# Patient Record
Sex: Female | Born: 1995 | Hispanic: No | Marital: Single | Smoking: Never smoker
Health system: Southern US, Community
[De-identification: ages and names within clinical notes are randomized; demographics above are authoritative.]

## PROBLEM LIST (undated history)

## (undated) DIAGNOSIS — B349 Viral infection, unspecified: Secondary | ICD-10-CM

## (undated) DIAGNOSIS — F32A Depression, unspecified: Secondary | ICD-10-CM

## (undated) DIAGNOSIS — F909 Attention-deficit hyperactivity disorder, unspecified type: Secondary | ICD-10-CM

## (undated) DIAGNOSIS — Z09 Encounter for follow-up examination after completed treatment for conditions other than malignant neoplasm: Secondary | ICD-10-CM

## (undated) DIAGNOSIS — F329 Major depressive disorder, single episode, unspecified: Secondary | ICD-10-CM

## (undated) DIAGNOSIS — J039 Acute tonsillitis, unspecified: Secondary | ICD-10-CM

## (undated) DIAGNOSIS — F419 Anxiety disorder, unspecified: Secondary | ICD-10-CM

## (undated) DIAGNOSIS — N10 Acute tubulo-interstitial nephritis: Principal | ICD-10-CM

## (undated) DIAGNOSIS — J0391 Acute recurrent tonsillitis, unspecified: Secondary | ICD-10-CM

## (undated) DIAGNOSIS — L709 Acne, unspecified: Secondary | ICD-10-CM

## (undated) HISTORY — DX: Viral infection, unspecified: B34.9

## (undated) HISTORY — DX: Acute pyelonephritis: N10

## (undated) HISTORY — PX: US ECHOCARDIOGRAPHY: HXRAD669

## (undated) HISTORY — DX: Acute recurrent tonsillitis, unspecified: J03.91

## (undated) HISTORY — DX: Acute tonsillitis, unspecified: J03.90

## (undated) HISTORY — PX: OTHER SURGICAL HISTORY: SHX169

## (undated) HISTORY — DX: Encounter for follow-up examination after completed treatment for conditions other than malignant neoplasm: Z09

## (undated) HISTORY — DX: Acne, unspecified: L70.9

## (undated) HISTORY — DX: Depression, unspecified: F32.A

## (undated) HISTORY — DX: Major depressive disorder, single episode, unspecified: F32.9

## (undated) HISTORY — DX: Anxiety disorder, unspecified: F41.9

## (undated) HISTORY — DX: Attention-deficit hyperactivity disorder, unspecified type: F90.9

---
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
## 2014-01-01 ENCOUNTER — Other Ambulatory Visit: Payer: Self-pay | Admitting: Internal Medicine

## 2014-01-04 ENCOUNTER — Telehealth: Payer: Self-pay | Admitting: Family Medicine

## 2014-01-04 NOTE — Telephone Encounter (Signed)
This patient is due for her yearly wellness exam.  Please schedule the pt.  Let me know when she has been scheduled.  Thanks!

## 2014-01-04 NOTE — Telephone Encounter (Signed)
Sent for #90 to the pharmacy.  Pt must have an office visit for further refills.  She is due for her yearly wellness.  Will send a message to the front desk staff to schedule the pt.

## 2014-01-04 NOTE — Telephone Encounter (Signed)
Pt will cb after she looks at her schedule for the best time to come in.

## 2014-02-03 ENCOUNTER — Telehealth: Payer: Self-pay | Admitting: Internal Medicine

## 2014-02-03 NOTE — Telephone Encounter (Signed)
Pt needs new rx vyvanse 40 mg. Mom would like to know when does her daughter needs check up to continue to get med. Pt will be going back to school in [place]

## 2014-02-03 NOTE — Telephone Encounter (Signed)
Pt is past due for her appointment.  Please schedule.  I will then ask Dr. Demetrius CharityP to refill her prescription until she is seen.

## 2014-02-04 MED ORDER — LISDEXAMFETAMINE DIMESYLATE 40 MG PO CAPS: 40.0000 mg | ORAL_CAPSULE | ORAL | Status: DC

## 2014-02-04 NOTE — Telephone Encounter (Addendum)
Lm with pt dad

## 2014-02-04 NOTE — Telephone Encounter (Signed)
Written for 30 days until she can see Dr. Fabian SharpPanosh

## 2014-02-04 NOTE — Telephone Encounter (Signed)
Pt has been scheduled for her OV. Mom said they will be on vacation next week and need to have the rx  Vyvanse 40mg  today pt only has 4 pills

## 2014-02-04 NOTE — Telephone Encounter (Signed)
Patient's father notified that rx is ready.  He will have patient's mother swing by and pick it up.

## 2014-02-04 NOTE — Telephone Encounter (Signed)
Pt is scheduled for 02/22/14.  Will see if Dr. Clent RidgesFry can fill as Dr. Fabian SharpPanosh is out of the office.

## 2014-02-22 ENCOUNTER — Encounter: Payer: Self-pay | Admitting: Internal Medicine

## 2014-02-22 ENCOUNTER — Ambulatory Visit (INDEPENDENT_AMBULATORY_CARE_PROVIDER_SITE_OTHER): Payer: BC Managed Care – PPO | Admitting: Internal Medicine

## 2014-02-22 VITALS — BP 110/80 | HR 91 | Temp 99.1°F | Ht 66.0 in | Wt 143.0 lb

## 2014-02-22 DIAGNOSIS — F988 Other specified behavioral and emotional disorders with onset usually occurring in childhood and adolescence: Secondary | ICD-10-CM

## 2014-02-22 DIAGNOSIS — F4323 Adjustment disorder with mixed anxiety and depressed mood: Secondary | ICD-10-CM

## 2014-02-22 DIAGNOSIS — Z79899 Other long term (current) drug therapy: Secondary | ICD-10-CM | POA: Insufficient documentation

## 2014-02-22 DIAGNOSIS — Z0000 Encounter for general adult medical examination without abnormal findings: Secondary | ICD-10-CM

## 2014-02-22 MED ORDER — LISDEXAMFETAMINE DIMESYLATE 40 MG PO CAPS: 40.0000 mg | ORAL_CAPSULE | ORAL | Status: DC

## 2014-02-22 MED ORDER — FLUOXETINE HCL 20 MG PO CAPS: 20.0000 mg | ORAL_CAPSULE | Freq: Every day | ORAL | Status: DC

## 2014-02-22 NOTE — Progress Notes (Signed)
Pre visit review using our clinic review tool, if applicable. No additional management support is needed unless otherwise documented below in the visit note.   Chief Complaint  Patient presents with  . Follow-up    3 mths add meds and fluoxetine  . Annual Exam    HPI: Patient comes in today for [place] visit  And also med check. For medical conditions. Since her last visit she has done fairly well but did have an episode wonders if she is allergic to shellfish.  ? Had lobster.  Extreme nausea. Vomiting dizziness.  No diarrhea. No rash itching or other swelling. Has never had this before ADD medication; Vyvanse taking every. Day and really helps her Focus if she doesn't take it it is very Hard to gett hings done. No change in special interactions.  Sleep ok. Feels that she tolerates well she is just finished her freshman year at [employer] interested in either nursing or psychology. Will be living in an apartment this year. Fluoxetine is really helped her mood would like to remain on it denies any specific side effects had been on it for anxiety with some depression. TObacco no etoh "normal" couple beers ocass Sweets beverages  Not a lot.  RD:  Denies SA no denies risk of preg or sti says has had hpv series Periods normal   Lasting 5 days  she is on OCPs no concerns. Would like cholesterol blood sugar checked she ate today but a while bacteria [place]. Health Maintenance  Topic Date Due  . Pap Smear  12/26/2012  . Tetanus/tdap  12/26/2013  . Influenza Vaccine  03/06/2014   Health Maintenance Review  ROS: No major injury concussions GI GU symptoms. GEN/ HEENT: No fever, significant weight changes sweats headaches vision problems hearing changes, CV/ PULM; No chest pain shortness of breath cough, syncope,edema  change in exercise tolerance. GI /GU: No adominal pain, vomiting, change in bowel habits. No blood in the stool. No significant GU symptoms. SKIN/HEME: ,no acute skin  rashes suspicious lesions or bleeding. No lymphadenopathy, nodules, masses.  NEURO/ PSYCH:  No neurologic signs such as weakness numbness. No depression anxiety. IMM/ Allergy: No unusual infections.  Allergy .   REST of 12 system review negative except as per HPI   Past Medical History  Diagnosis Date  . ADHD (attention deficit hyperactivity disorder)     on meds from 3rd grade, psycho ed testing May 2011 [facility]  . Anxiety   . Depression   . Acne   . Tonsillitis 10/29/2011    10 13 strep  . Infectious mononucleosis-like syndrome 09/25/2011    Monospot negative blood work pending strep culture pending. Non-alarming exam   . 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 Acute pyelonephritis 02/18/2013    with nv self rx with amox    . Recurrent tonsillitis 06/05/2012    Family History  Problem Relation Age of Onset  . Depression Father     History   Social History  . Marital Status: Single    Spouse Name: N/A    Number of Children: N/A  . Years of Education: N/A   Social History Main Topics  . Smoking status: Never Smoker   . Smokeless tobacco: Never Used  . Alcohol Use: No  . Drug Use: No  . Sexual Activity: None   Other Topics Concern  . None   Social History Narrative   HH of 4  Pet dog   [employer] since Kindergarten   [place]   Sleep ok   [place] [facility] psy[employer]    To be living appt . Next year     Outpatient Encounter Prescriptions as of 02/22/2014  Medication Sig  . FLUoxetine (PROZAC) 20 MG capsule Take 1 capsule (20 mg total) by mouth daily.  [name] GIANVI 3-0.02 MG tablet Take 1 tablet by mouth daily.  [name] lisdexamfetamine (VYVANSE) 40 MG capsule Take 1 capsule (40 mg total) by mouth every morning.  . [DISCONTINUED] FLUoxetine (PROZAC) 20 MG capsule TAKE ONE CAPSULE BY MOUTH DAILY  . [DISCONTINUED] lisdexamfetamine (VYVANSE) 40 MG capsule Take 1 capsule (40 mg total) by mouth every  morning.  . [DISCONTINUED] FLUoxetine (PROZAC) 20 MG capsule TAKE 1 CAPSULE (20 MG TOTAL) BY MOUTH DAILY.    EXAM:  BP 110/80  Pulse 91  Temp(Src) 99.1 F (37.3 C) (Oral)  Ht _0  (1.676 m)  Wt 143 lb (64.864 kg)  BMI 23.09 kg/m2  Body mass index is 23.09 kg/(m^2).  Physical Exam: Vital signs reviewed NOI:BBCW is a well-developed well-nourished alert cooperative    who appearsr stated age in no acute distress.  HEENT: normocephalic atraumatic , Eyes: PERRL EOM's full, conjunctiva clear, Nares: paten,t no deformity discharge or tenderness., Ears: no deformity EAC's clear TMs with normal landmarks. Mouth: clear OP, no lesions, edema.  Moist mucous membranes. Dentition in adequate repair. NECK: supple without masses, thyromegaly or bruits. CHEST/PULM:  Clear to auscultation and percussion breath sounds equal no wheeze , rales or rhonchi. No chest wall deformities or tenderness. CV: PMI is nondisplaced, S1 S2 no gallops, murmurs, rubs. Peripheral pulses are full without delay.No JVD  Breast: normal by inspection . No dimpling, discharge, masses, tenderness or discharge . Right has 3 superfical pink areas not really nodules  Nipple clear .  ABDOMEN: Bowel sounds normal nontender  No guard or rebound, no hepato splenomegal no CVA tenderness.  No hernia. Extremtities:  No clubbing cyanosis or edema, no acute joint swelling or redness no focal atrophy NEURO:  Oriented x3, cranial nerves 3-12 appear to be intact, no obvious focal weakness,gait within normal limits no abnormal reflexes or asymmetrical SKIN: No acute rashes normal turgor, color, no bruising or petechiae. PSYCH: Oriented, good eye contact, no obvious depression anxiety, cognition and judgment appear normal. LN: no cervical axillary inguinal adenopathy Back no scoliosis no focal concerns.  Lab Results  Component Value Date   WBC 6.2 08/03/2013   HGB 13.3 08/03/2013   HCT 39.0 08/03/2013   PLT 211.0 08/03/2013   GLUCOSE 84  08/03/2013   CHOL 121 09/22/2012   TRIG 38.0 09/22/2012   HDL 50.60 09/22/2012   LDLCALC 63 09/22/2012   ALT 13 09/22/2012   AST 17 09/22/2012   NA 137 08/03/2013   K 4.3 08/03/2013   CL 104 08/03/2013   CREATININE 0.6 08/03/2013   BUN 10 08/03/2013   CO2 27 08/03/2013   TSH 2.17 08/03/2013    ASSESSMENT AND PLAN:  Discussed the following assessment and plan:  Visit for preventive health examination - Apparently up-to-date flu vaccine in the fall; a 10 to continue healthy lifestyle - Plan: Lipid panel, Basic metabolic panel  Attention deficit disorder without mention of hyperactivity - Continue medication benefit more than risk without significant side effect dispense 90 patient is aware precautions high-risk medicine  Medication management - Plan: Lipid panel, Basic metabolic panel  Adjustment disorder with mixed anxiety  and depressed mood - Seems to be quite stable on fluoxetine 20 mg a day refill 90 will follow.  Patient Care Team: Burnis Medin, MD as PCP - General Patient Instructions  ROV in about 6 months for med evaluation or other as needed. I think the breast area is a plugged gland  But recheck if getting  More and progressing.   Health Maintenance - 28-56 Years Old SCHOOL PERFORMANCE After high school, you may attend college or technical or vocational school, enroll in the [employer], or enter the workforce. PHYSICAL, SOCIAL, AND EMOTIONAL DEVELOPMENT  One hour of regular physical activity daily is recommended. Continue to participate in sports.  Develop your own interests and consider community service or volunteerism.  Make decisions about college and work plans.  Throughout these years, you should assume responsibility for your own health care. Increasing independence is important for you.  You may be exploring your sexual identity. Understand that you should never be in a situation that makes you feel uncomfortable, and tell your partner if you do not want to  engage in sexual activity.  Body image may become important to you. Be mindful that eating disorders can develop at this time. Talk to your parents or other caregivers if you have concerns about body image, weight gain, or losing weight.  You may notice mood disturbances, depression, anxiety, attention problems, or trouble with alcohol. Talk to your health care provider if you have concerns about mental illness.  Set limits for yourself and talk with your parents or other caregivers about independent decision making.  Handle conflict without physical violence.  Avoid loud noises which may impair hearing.  Limit television and computer time to 2 hours each day. Individuals who engage in excessive inactivity are more likely to become overweight. RECOMMENDED IMMUNIZATIONS  Influenza vaccine.  All adults should be immunized every year.  All adults, including pregnant women and people with hives-only allergy to eggs, can receive the inactivated influenza (IIV) vaccine.  Adults aged 18-49 years can receive the recombinant influenza (RIV) vaccine. The RIV vaccine does not contain any egg protein.  Tetanus, diphtheria, and acellular pertussis (Td, Tdap) vaccine.  Pregnant women should receive 1 dose of Tdap vaccine during each pregnancy. The dose should be obtained regardless of the length of time since the last dose. Immunization is preferred during the 27th to 36th week of gestation.  An adult who has not previously received Tdap or who does not know his or her vaccine status should receive 1 dose of Tdap. This initial dose should be followed by tetanus and diphtheria toxoids (Td) booster doses every 10 years.  Adults with an unknown or incomplete history of completing a 3-dose immunization series with Td-containing vaccines should begin or complete a primary immunization series including a Tdap dose.  Adults should receive a Td booster every 10 years.  Varicella vaccine.  An adult  without evidence of immunity to varicella should receive 2 doses or a second dose if he or she has previously received 1 dose.  Pregnant females who do not have evidence of immunity should receive the first dose after pregnancy. This first dose should be obtained before leaving the health care facility. The second dose should be obtained 4-8 weeks after the first dose.  Human papillomavirus (HPV) vaccine.  Females aged 13-26 years who have not received the vaccine previously should obtain the 3-dose series.  The vaccine is not recommended for pregnant females. However, pregnancy testing is not needed before receiving  a dose. If a female is found to be pregnant after receiving a dose, no treatment is needed. In that case, the remaining doses should be delayed until after the pregnancy.  Males aged 37-21 years who have not received the vaccine previously should receive the 3-dose series. Males aged 22-26 years may be immunized.  Immunization is recommended through the age of 59 years for any female who has sex with males and did not get any or all doses earlier.  Immunization is recommended for any person with an immunocompromised condition through the age of 65 years if he or she did not get any or all doses earlier.  During the 3-dose series, the second dose should be obtained 4-8 weeks after the first dose. The third dose should be obtained 24 weeks after the first dose and 16 weeks after the second dose.  Measles, mumps, and rubella (MMR) vaccine.  Adults born in [birth year] or later should have 1 or more doses of MMR vaccine unless there is a contraindication to the vaccine or there is laboratory evidence of immunity to each of the three diseases.  A routine second dose of MMR vaccine should be obtained at least 28 days after the first dose for students attending postsecondary schools, health care workers, and international travelers.  For females of childbearing age, rubella immunity should be  determined. If there is no evidence of immunity, females who are not pregnant should be vaccinated. If there is no evidence of immunity, females who are pregnant should delay immunization until after pregnancy.  Pneumococcal 13-valent conjugate (PCV13) vaccine.  When indicated, a person who is uncertain of his or her immunization history and has no record of immunization should receive the PCV13 vaccine.  An adult aged 62 years or older who has certain medical conditions and has not been previously immunized should receive 1 dose of PCV13 vaccine. This PCV13 should be followed with a dose of pneumococcal polysaccharide (PPSV23) vaccine. The PPSV23 vaccine dose should be obtained at least 8 weeks after the dose of PCV13 vaccine.  An adult aged 33 years or older who has certain medical conditions and previously received 1 or more doses of PPSV23 vaccine should receive 1 dose of PCV13. The PCV13 vaccine dose should be obtained 1 or more years after the last PPSV23 vaccine dose.  Pneumococcal polysaccharide (PPSV23) vaccine.  When PCV13 is also indicated, PCV13 should be obtained first.  An adult younger than age 43 years who has certain medical conditions should be immunized.  Any person who resides in a long-term care facility should be immunized.  An adult smoker should be immunized.  People with an immunocompromised condition and certain other conditions should receive both PCV13 and PPSV23 vaccines.  People with human immunodeficiency virus (HIV) infection should be immunized as soon as possible after diagnosis.  Immunization during chemotherapy or radiation therapy should be avoided.  Routine use of PPSV23 vaccine is not recommended for American Indians, [place] Natives, or people younger than 65 years unless there are medical conditions that require PPSV23 vaccine.  When indicated, people who have unknown immunization and have no record of immunization should receive PPSV23  vaccine.  One-time revaccination 5 years after the first dose of PPSV23 is recommended for people aged 19-64 years who have chronic kidney failure, nephrotic syndrome, asplenia, or immunocompromised conditions.  Meningococcal vaccine.  Adults with asplenia or persistent complement component deficiencies should receive 2 doses of quadrivalent meningococcal conjugate (MenACWY-D) vaccine. The doses should be obtained at least  2 months apart.  Microbiologists working with certain meningococcal bacteria, [place] recruits, people at risk during an outbreak, and people who travel to or live in countries with a high rate of meningitis should be immunized.  A first-year college student up through age 75 years who is living in a residence hall should receive a dose if he or she did not receive a dose on or after his or her 16th birthday.  Adults who have certain high-risk conditions should receive one or more doses of vaccine.  Hepatitis A vaccine.  Adults who wish to be protected from this disease, have certain high-risk conditions, work with hepatitis A-infected animals, work in hepatitis A research labs, or travel to or work in countries with a high rate of hepatitis A should be immunized.  Adults who were previously unvaccinated and who anticipate close contact with an international adoptee during the first 60 days after arrival in the [place] from a country with a high rate of hepatitis A should be immunized.  Hepatitis B vaccine.  Adults who wish to be protected from this disease, have certain high-risk conditions, may be exposed to blood or other infectious body fluids, are household contacts or sex partners of hepatitis B positive people, are clients or workers in certain care facilities, or travel to or work in countries with a high rate of hepatitis B should be immunized.  Haemophilus influenzae type b (Hib) vaccine.  A previously unvaccinated person with asplenia or sickle cell  disease or having a scheduled splenectomy should receive 1 dose of Hib vaccine.  Regardless of previous immunization, a recipient of a hematopoietic stem cell transplant should receive a 3-dose series 6-12 months after his or her successful transplant.  Hib vaccine is not recommended for adults with HIV infection. TESTING  Annual screening for vision and hearing problems is recommended. Vision should be screened at least once between 5-33 years of age.  You may be screened for anemia or tuberculosis.  You should have a blood test to check for high cholesterol.  You should be screened for alcohol and drug use.  If you are sexually active, you may be screened for sexually transmitted infections (STIs), pregnancy, or HIV. You should be screened for STIs if:  Your sexual activity has changed since the last screening test, and you are at an increased risk for chlamydia or gonorrhea. Ask your health care provider if you are at risk.  If you are at an increased risk for hepatitis B, you should be screened for this virus. You are considered at high risk for hepatitis B if you:  Were born in a country where hepatitis B occurs often. Talk with your health care provider about which countries are considered high risk.  Have parents who were born in a high-risk country and have not received a shot to protect against hepatitis B (hepatitis B vaccine).  Have HIV or AIDS.  Use needles to inject street drugs.  Live with or have sex with someone who has hepatitis B.  Are a man who has sex with other men (MSM).  Get hemodialysis treatment.  Take certain medicines for conditions like cancer, organ transplantation, or autoimmune conditions. NUTRITION   You should:  Have three servings of low-fat milk and dairy products daily. If you do not drink milk or consume dairy products, you should eat calcium-enriched foods, such as juice, bread, or cereal. Dark, leafy greens or canned fish are alternate  sources of calcium.  Drink plenty of water.  Fruit juice should be limited to 8-12 oz (240-360 mL) each day. Sugary beverages and sodas should be avoided.  Avoid eating foods high in fat, salt, or sugar, such as chips, candy, and cookies.  Avoid fast foods and limit eating out at restaurants.  Try not to skip meals, especially breakfast. You should eat a variety of vegetables, fruits, and lean meats.  Eat meals together as a family whenever possible. ORAL HEALTH Brush your teeth twice a day and floss at least once a day. You should have two dental exams a year.  SKIN CARE You should wear sunscreen when out in the sun. TALK TO SOMEONE ABOUT:  Precautions against pregnancy, contraception, and sexually transmitted infections.  Taking a prescription medicine daily to prevent HIV infection if you are at risk of being infected with HIV. This is called preexposure prophylaxis (PrEP). You are at risk if you:  Are a female who has sex with other males (MSM).  Are heterosexual and sexually active with more than one partner.  Take drugs by injection.  Are sexually active with a partner who has HIV.  Whether you are at high risk of being infected with HIV. If you choose to begin PrEP, you should first be tested for HIV. You should then be tested every 3 months for as long as you are taking PrEP.  Drug, tobacco, and alcohol use among your friends or at friends' homes. Smoking tobacco or marijuana and taking drugs have health consequences and may impact your brain development.  Appropriate use of over-the-counter or prescription medicines.  Driving guidelines and riding with friends.  The risks of drinking and driving or boating. Call someone if you have been drinking or using drugs and need a ride. WHAT'S NEXT? Visit your pediatrician or family physician once a year. By young adulthood, you should transition from your pediatrician to a family physician or internal medicine specialist. If you  are a female and are sexually active, you may want to begin annual physical exams with a gynecologist. Document Released: 10/18/2006 Document Revised: 07/28/2013 Document Reviewed: 11/07/2006 [facility] Patient Information 2015 [place], [place]. This information is not intended to replace advice given to you by your health care provider. Make sure you discuss any questions you have with your health care provider.     Standley Brooking. Panosh M.D.

## 2014-02-22 NOTE — Patient Instructions (Addendum)
ROV in about 6 months for med evaluation or other as needed. I think the breast area is a plugged gland  But recheck if getting  More and progressing.   Health Maintenance - 4-19 Years Old SCHOOL PERFORMANCE After high school, you may attend college or technical or vocational school, enroll in the [employer], or enter the workforce. PHYSICAL, SOCIAL, AND EMOTIONAL DEVELOPMENT  One hour of regular physical activity daily is recommended. Continue to participate in sports.  Develop your own interests and consider community service or volunteerism.  Make decisions about college and work plans.  Throughout these years, you should assume responsibility for your own health care. Increasing independence is important for you.  You may be exploring your sexual identity. Understand that you should never be in a situation that makes you feel uncomfortable, and tell your partner if you do not want to engage in sexual activity.  Body image may become important to you. Be mindful that eating disorders can develop at this time. Talk to your parents or other caregivers if you have concerns about body image, weight gain, or losing weight.  You may notice mood disturbances, depression, anxiety, attention problems, or trouble with alcohol. Talk to your health care provider if you have concerns about mental illness.  Set limits for yourself and talk with your parents or other caregivers about independent decision making.  Handle conflict without physical violence.  Avoid loud noises which may impair hearing.  Limit television and computer time to 2 hours each day. Individuals who engage in excessive inactivity are more likely to become overweight. RECOMMENDED IMMUNIZATIONS  Influenza vaccine.  All adults should be immunized every year.  All adults, including pregnant women and people with hives-only allergy to eggs, can receive the inactivated influenza (IIV) vaccine.  Adults aged 18-49 years can  receive the recombinant influenza (RIV) vaccine. The RIV vaccine does not contain any egg protein.  Tetanus, diphtheria, and acellular pertussis (Td, Tdap) vaccine.  Pregnant women should receive 1 dose of Tdap vaccine during each pregnancy. The dose should be obtained regardless of the length of time since the last dose. Immunization is preferred during the 27th to 36th week of gestation.  An adult who has not previously received Tdap or who does not know his or her vaccine status should receive 1 dose of Tdap. This initial dose should be followed by tetanus and diphtheria toxoids (Td) booster doses every 10 years.  Adults with an unknown or incomplete history of completing a 3-dose immunization series with Td-containing vaccines should begin or complete a primary immunization series including a Tdap dose.  Adults should receive a Td booster every 10 years.  Varicella vaccine.  An adult without evidence of immunity to varicella should receive 2 doses or a second dose if he or she has previously received 1 dose.  Pregnant females who do not have evidence of immunity should receive the first dose after pregnancy. This first dose should be obtained before leaving the health care facility. The second dose should be obtained 4-8 weeks after the first dose.  Human papillomavirus (HPV) vaccine.  Females aged 13-26 years who have not received the vaccine previously should obtain the 3-dose series.  The vaccine is not recommended for pregnant females. However, pregnancy testing is not needed before receiving a dose. If a female is found to be pregnant after receiving a dose, no treatment is needed. In that case, the remaining doses should be delayed until after the pregnancy.  Males aged 13-21  years who have not received the vaccine previously should receive the 3-dose series. Males aged 22-26 years may be immunized.  Immunization is recommended through the age of 45 years for any female who has sex  with males and did not get any or all doses earlier.  Immunization is recommended for any person with an immunocompromised condition through the age of 51 years if he or she did not get any or all doses earlier.  During the 3-dose series, the second dose should be obtained 4-8 weeks after the first dose. The third dose should be obtained 24 weeks after the first dose and 16 weeks after the second dose.  Measles, mumps, and rubella (MMR) vaccine.  Adults born in [birth year] or later should have 1 or more doses of MMR vaccine unless there is a contraindication to the vaccine or there is laboratory evidence of immunity to each of the three diseases.  A routine second dose of MMR vaccine should be obtained at least 28 days after the first dose for students attending postsecondary schools, health care workers, and international travelers.  For females of childbearing age, rubella immunity should be determined. If there is no evidence of immunity, females who are not pregnant should be vaccinated. If there is no evidence of immunity, females who are pregnant should delay immunization until after pregnancy.  Pneumococcal 13-valent conjugate (PCV13) vaccine.  When indicated, a person who is uncertain of his or her immunization history and has no record of immunization should receive the PCV13 vaccine.  An adult aged 45 years or older who has certain medical conditions and has not been previously immunized should receive 1 dose of PCV13 vaccine. This PCV13 should be followed with a dose of pneumococcal polysaccharide (PPSV23) vaccine. The PPSV23 vaccine dose should be obtained at least 8 weeks after the dose of PCV13 vaccine.  An adult aged 59 years or older who has certain medical conditions and previously received 1 or more doses of PPSV23 vaccine should receive 1 dose of PCV13. The PCV13 vaccine dose should be obtained 1 or more years after the last PPSV23 vaccine dose.  Pneumococcal polysaccharide (PPSV23)  vaccine.  When PCV13 is also indicated, PCV13 should be obtained first.  An adult younger than age 70 years who has certain medical conditions should be immunized.  Any person who resides in a long-term care facility should be immunized.  An adult smoker should be immunized.  People with an immunocompromised condition and certain other conditions should receive both PCV13 and PPSV23 vaccines.  People with human immunodeficiency virus (HIV) infection should be immunized as soon as possible after diagnosis.  Immunization during chemotherapy or radiation therapy should be avoided.  Routine use of PPSV23 vaccine is not recommended for American Indians, [place] Natives, or people younger than 65 years unless there are medical conditions that require PPSV23 vaccine.  When indicated, people who have unknown immunization and have no record of immunization should receive PPSV23 vaccine.  One-time revaccination 5 years after the first dose of PPSV23 is recommended for people aged 19-64 years who have chronic kidney failure, nephrotic syndrome, asplenia, or immunocompromised conditions.  Meningococcal vaccine.  Adults with asplenia or persistent complement component deficiencies should receive 2 doses of quadrivalent meningococcal conjugate (MenACWY-D) vaccine. The doses should be obtained at least 2 months apart.  Microbiologists working with certain meningococcal bacteria, [employer], people at risk during an outbreak, and people who travel to or live in countries with a high rate of meningitis should be  immunized.  A first-year college student up through age 81 years who is living in a residence hall should receive a dose if he or she did not receive a dose on or after his or her 16th birthday.  Adults who have certain high-risk conditions should receive one or more doses of vaccine.  Hepatitis A vaccine.  Adults who wish to be protected from this disease, have certain high-risk  conditions, work with hepatitis A-infected animals, work in hepatitis A research labs, or travel to or work in countries with a high rate of hepatitis A should be immunized.  Adults who were previously unvaccinated and who anticipate close contact with an international adoptee during the first 60 days after arrival in the [place] from a country with a high rate of hepatitis A should be immunized.  Hepatitis B vaccine.  Adults who wish to be protected from this disease, have certain high-risk conditions, may be exposed to blood or other infectious body fluids, are household contacts or sex partners of hepatitis B positive people, are clients or workers in certain care facilities, or travel to or work in countries with a high rate of hepatitis B should be immunized.  Haemophilus influenzae type b (Hib) vaccine.  A previously unvaccinated person with asplenia or sickle cell disease or having a scheduled splenectomy should receive 1 dose of Hib vaccine.  Regardless of previous immunization, a recipient of a hematopoietic stem cell transplant should receive a 3-dose series 6-12 months after his or her successful transplant.  Hib vaccine is not recommended for adults with HIV infection. TESTING  Annual screening for vision and hearing problems is recommended. Vision should be screened at least once between 57-52 years of age.  You may be screened for anemia or tuberculosis.  You should have a blood test to check for high cholesterol.  You should be screened for alcohol and drug use.  If you are sexually active, you may be screened for sexually transmitted infections (STIs), pregnancy, or HIV. You should be screened for STIs if:  Your sexual activity has changed since the last screening test, and you are at an increased risk for chlamydia or gonorrhea. Ask your health care provider if you are at risk.  If you are at an increased risk for hepatitis B, you should be screened for this virus.  You are considered at high risk for hepatitis B if you:  Were born in a country where hepatitis B occurs often. Talk with your health care provider about which countries are considered high risk.  Have parents who were born in a high-risk country and have not received a shot to protect against hepatitis B (hepatitis B vaccine).  Have HIV or AIDS.  Use needles to inject street drugs.  Live with or have sex with someone who has hepatitis B.  Are a man who has sex with other men (MSM).  Get hemodialysis treatment.  Take certain medicines for conditions like cancer, organ transplantation, or autoimmune conditions. NUTRITION   You should:  Have three servings of low-fat milk and dairy products daily. If you do not drink milk or consume dairy products, you should eat calcium-enriched foods, such as juice, bread, or cereal. Dark, leafy greens or canned fish are alternate sources of calcium.  Drink plenty of water. Fruit juice should be limited to 8-12 oz (240-360 mL) each day. Sugary beverages and sodas should be avoided.  Avoid eating foods high in fat, salt, or sugar, such as chips, candy, and cookies.  Avoid fast foods and limit eating out at restaurants.  Try not to skip meals, especially breakfast. You should eat a variety of vegetables, fruits, and lean meats.  Eat meals together as a family whenever possible. ORAL HEALTH Brush your teeth twice a day and floss at least once a day. You should have two dental exams a year.  SKIN CARE You should wear sunscreen when out in the sun. TALK TO SOMEONE ABOUT:  Precautions against pregnancy, contraception, and sexually transmitted infections.  Taking a prescription medicine daily to prevent HIV infection if you are at risk of being infected with HIV. This is called preexposure prophylaxis (PrEP). You are at risk if you:  Are a female who has sex with other males (MSM).  Are heterosexual and sexually active with more than one  partner.  Take drugs by injection.  Are sexually active with a partner who has HIV.  Whether you are at high risk of being infected with HIV. If you choose to begin PrEP, you should first be tested for HIV. You should then be tested every 3 months for as long as you are taking PrEP.  Drug, tobacco, and alcohol use among your friends or at friends' homes. Smoking tobacco or marijuana and taking drugs have health consequences and may impact your brain development.  Appropriate use of over-the-counter or prescription medicines.  Driving guidelines and riding with friends.  The risks of drinking and driving or boating. Call someone if you have been drinking or using drugs and need a ride. WHAT'S NEXT? Visit your pediatrician or family physician once a year. By young adulthood, you should transition from your pediatrician to a family physician or internal medicine specialist. If you are a female and are sexually active, you may want to begin annual physical exams with a gynecologist. Document Released: 10/18/2006 Document Revised: 07/28/2013 Document Reviewed: 11/07/2006 [facility] Patient Information 2015 [place], [place]. This information is not intended to replace advice given to you by your health care provider. Make sure you discuss any questions you have with your health care provider.

## 2014-02-23 LAB — BASIC METABOLIC PANEL
BUN: 10 mg/dL (ref 6–23)
CO2: 27 mEq/L (ref 19–32)
Calcium: 9.1 mg/dL (ref 8.4–10.5)
Chloride: 108 mEq/L (ref 96–112)
Creatinine, Ser: 0.6 mg/dL (ref 0.4–1.2)
GFR: 139.32 mL/min (ref >60.00)
Glucose, Bld: 82 mg/dL (ref 70–99)
Potassium: 4.4 mEq/L (ref 3.5–5.1)
Sodium: 139 mEq/L (ref 135–145)

## 2014-02-23 LAB — LIPID PANEL
CHOL/HDL RATIO: 2
Cholesterol: 129 mg/dL (ref 0–200)
HDL: 56.8 mg/dL (ref >39.00)
LDL CALC: 51 mg/dL (ref 0–99)
NonHDL: 72.2
TRIGLYCERIDES: 108 mg/dL (ref 0.0–149.0)
VLDL: 21.6 mg/dL (ref 0.0–40.0)

## 2014-02-26 ENCOUNTER — Encounter: Payer: Self-pay | Admitting: Family Medicine

## 2014-05-31 ENCOUNTER — Telehealth: Payer: Self-pay | Admitting: Internal Medicine

## 2014-05-31 MED ORDER — LISDEXAMFETAMINE DIMESYLATE 40 MG PO CAPS: 40.0000 mg | ORAL_CAPSULE | ORAL | Status: DC

## 2014-05-31 NOTE — Telephone Encounter (Signed)
Prozac was filled for 1 year on 02/22/14.  Vyvanse is ready for pick up.  Left a message on home phone.

## 2014-05-31 NOTE — Telephone Encounter (Signed)
Mom calling for a refill on Vyvanse 40mg  and fluoxitine 20mg . Please call when ready for pick up. Ok to leave a message.

## 2014-07-22 ENCOUNTER — Encounter: Payer: Self-pay | Admitting: Internal Medicine

## 2014-07-22 ENCOUNTER — Ambulatory Visit (INDEPENDENT_AMBULATORY_CARE_PROVIDER_SITE_OTHER): Payer: BC Managed Care – PPO | Admitting: Internal Medicine

## 2014-07-22 VITALS — BP 120/60 | Temp 98.8°F | Ht 66.0 in | Wt 144.7 lb

## 2014-07-22 DIAGNOSIS — F988 Other specified behavioral and emotional disorders with onset usually occurring in childhood and adolescence: Secondary | ICD-10-CM

## 2014-07-22 DIAGNOSIS — IMO0001 Reserved for inherently not codable concepts without codable children: Secondary | ICD-10-CM | POA: Insufficient documentation

## 2014-07-22 DIAGNOSIS — F4323 Adjustment disorder with mixed anxiety and depressed mood: Secondary | ICD-10-CM

## 2014-07-22 DIAGNOSIS — R61 Generalized hyperhidrosis: Secondary | ICD-10-CM

## 2014-07-22 DIAGNOSIS — Z79899 Other long term (current) drug therapy: Secondary | ICD-10-CM

## 2014-07-22 DIAGNOSIS — F909 Attention-deficit hyperactivity disorder, unspecified type: Secondary | ICD-10-CM

## 2014-07-22 DIAGNOSIS — R42 Dizziness and giddiness: Secondary | ICD-10-CM

## 2014-07-22 LAB — CBC WITH DIFFERENTIAL/PLATELET
BASOS PCT: 0.4 % (ref 0.0–3.0)
Basophils Absolute: 0 10*3/uL (ref 0.0–0.1)
Eosinophils Absolute: 0.3 10*3/uL (ref 0.0–0.7)
Eosinophils Relative: 4.5 % (ref 0.0–5.0)
HCT: 40.8 % (ref 36.0–49.0)
HEMOGLOBIN: 13.1 g/dL (ref 12.0–16.0)
Lymphocytes Relative: 29.9 % (ref 24.0–48.0)
Lymphs Abs: 1.8 10*3/uL (ref 0.7–4.0)
MCHC: 32.2 g/dL (ref 31.0–37.0)
MCV: 83.1 fl (ref 78.0–98.0)
MONOS PCT: 7.2 % (ref 3.0–12.0)
Monocytes Absolute: 0.4 10*3/uL (ref 0.1–1.0)
NEUTROS ABS: 3.4 10*3/uL (ref 1.4–7.7)
Neutrophils Relative %: 58 % (ref 43.0–71.0)
Platelets: 160 10*3/uL (ref 150.0–575.0)
RBC: 4.91 Mil/uL (ref 3.80–5.70)
RDW: 15.1 % (ref 11.4–15.5)
WBC: 5.9 10*3/uL (ref 4.5–13.5)

## 2014-07-22 LAB — BASIC METABOLIC PANEL
BUN: 11 mg/dL (ref 6–23)
CALCIUM: 9.9 mg/dL (ref 8.4–10.5)
CO2: 26 meq/L (ref 19–32)
Chloride: 108 mEq/L (ref 96–112)
Creatinine, Ser: 0.8 mg/dL (ref 0.4–1.2)
GFR: 103.58 mL/min (ref >60.00)
Glucose, Bld: 91 mg/dL (ref 70–99)
POTASSIUM: 4.7 meq/L (ref 3.5–5.1)
Sodium: 142 mEq/L (ref 135–145)

## 2014-07-22 LAB — FERRITIN: Ferritin: 17.2 ng/mL (ref 10.0–291.0)

## 2014-07-22 LAB — HEPATIC FUNCTION PANEL
ALBUMIN: 4.3 g/dL (ref 3.5–5.2)
ALK PHOS: 44 U/L — AB (ref 47–119)
ALT: 36 U/L — AB (ref 0–35)
AST: 29 U/L (ref 0–37)
Bilirubin, Direct: 0.1 mg/dL (ref 0.0–0.3)
TOTAL PROTEIN: 7.2 g/dL (ref 6.0–8.3)
Total Bilirubin: 0.2 mg/dL (ref 0.2–1.2)

## 2014-07-22 LAB — TSH: TSH: 0.67 u[IU]/mL (ref 0.40–5.00)

## 2014-07-22 LAB — T3, FREE: T3, Free: 3.7 pg/mL (ref 2.3–4.2)

## 2014-07-22 LAB — T4, FREE: Free T4: 1.22 ng/dL (ref 0.60–1.60)

## 2014-07-22 MED ORDER — CITALOPRAM HYDROBROMIDE 20 MG PO TABS: 20.0000 mg | ORAL_TABLET | Freq: Every day | ORAL | Status: DC

## 2014-07-22 MED ORDER — LISDEXAMFETAMINE DIMESYLATE 40 MG PO CAPS: 40.0000 mg | ORAL_CAPSULE | ORAL | Status: DC

## 2014-07-22 NOTE — Progress Notes (Signed)
Pre visit review using our clinic review tool, if applicable. No additional management support is needed unless otherwise documented below in the visit note.   Chief Complaint  Patient presents with  . Follow-up    adhd  sweating with prozac  anxiety    HPI: [employer] E[employer]helberger 18 y.o.   come sin for fu adhd med  depression anxiety .  Thinks she may have se of the prozac  ? If sweating  From the depression medicine .   Stomach aches     Not as good  Help with  Anxiety  There.  Tried 2 per day. 40 mg and helped anxiety but sweating worse.would like to try another med . School going ok  Pretty good.  Psychology .  ECU adhd  Med: takes med about   9 - 10  Not keeping up. Helps focus on anything .  Productivity . [name].   Better.   15  Credit hours .  Sleep  6 hours .+  Tobacco  no Etoh:ocass  Periods     At obgyne   To do continuous . At this time had anemia low iron .  Sleep tries to eat regular .  ROS: See pertinent positives and negatives per HPI.has recent ortho static dizziness and near faint but no exercise intolerance no fever cp sob bleeding  Past Medical History  Diagnosis Date  . ADHD (attention deficit hyperactivity disorder)     on meds from 3rd grade, psycho ed testing [facility]  . Anxiety   . Depression   . Acne   . Tonsillitis 10/29/2011    10 13 strep  . Infectious mononucleosis-like syndrome 09/25/2011    Monospot negative blood work pending strep culture pending. Non-alarming exam   . 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 Acute pyelonephritis 02/18/2013    with nv self rx with amox    . Recurrent tonsillitis 06/05/2012    Family History  Problem Relation Age of Onset  . Depression Father     History   Social History  . Marital Status: Single    Spouse Name: N/A    Number of Children: N/A  . Years of Education: N/A   Social History Main Topics  . Smoking status: Never Smoker   . Smokeless tobacco: Never Used   . Alcohol Use: No  . Drug Use: No  . Sexual Activity: None   Other Topics Concern  . 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 Next year     Outpatient Encounter Prescriptions as of 07/22/2014  Medication Sig  . GIANVI 3-0.02 MG tablet Take 1 tablet by mouth daily.  [name]. lisdexamfetamine (VYVANSE) 40 MG capsule Take 1 capsule (40 mg total) by mouth every morning.  . [DISCONTINUED] FLUoxetine (PROZAC) 20 MG capsule Take 1 capsule (20 mg total) by mouth daily.  . [DISCONTINUED] lisdexamfetamine (VYVANSE) 40 MG capsule Take 1 capsule (40 mg total) by mouth every morning.  . citalopram (CELEXA) 20 MG tablet Take 1 tablet (20 mg total) by mouth daily.    EXAM:  BP 120/60 mmHg  Temp(Src) 98.8 F (37.1 C) (Oral)  Ht 5\' 6"  (1.676 m)  Wt 144 lb 11.2  oz (65.635 kg)  BMI 23.37 kg/m2  Body mass index is 23.37 kg/(m^2).  GENERAL: vitals reviewed and listed above, alert, oriented, appears well hydrated and in no acute distress mild anxiety  HEENT: atraumatic, conjunctiva  clear, no obvious abnormalities on inspection of external nose and ears tms clear OP : no lesion edema or exudate  NECK: no obvious masses on inspection palpation n oadenopathy LUNGS: clear to auscultation bilaterally, no wheezes, rales or rhonchi, good air movement CV: HRRR, no clubbing cyanosis or  peripheral edema nl cap refill  Abdomen:  Sof,t normal bowel sounds without hepatosplenomegaly, no guarding rebound or masses no CVA tenderness MS: moves all extremities without noticeable focal  abnormality PSYCH: pleasant and cooperative, no obvious depression good eye contact  Mild anxiety   ASSESSMENT AND PLAN:  Discussed the following assessment and plan:  Sweating - Plan: Basic metabolic panel, CBC with Differential, Hepatic function panel, TSH, T4, free, Ferritin, T3,  free  Adjustment disorder with mixed anxiety and depressed mood - anxiety predominant  - Plan: Basic metabolic panel, CBC with Differential, Hepatic function panel, TSH, T4, free, Ferritin, T3, free  Medication management - Plan: Basic metabolic panel, CBC with Differential, Hepatic function panel, TSH, T4, free, Ferritin, T3, free  Attention deficit disorder - Plan: Basic metabolic panel, CBC with Differential, Hepatic function panel, TSH, T4, free, Ferritin, T3, free  Orthostatic dizziness - recent on set  now on continueous therapy ocps - Plan: Basic metabolic panel, CBC with Differential, Hepatic function panel, TSH, T4, free, Ferritin, T3, free Sweating could be from either med but assoc with the ssri  No guarantee others couldn do this  R/o metabolic  Change med sign up for my chart and contact about  How doing  Consider snri if needed. Get in to counseling  On campus or previous counselor  -Patient advised to return or notify health care team  if symptoms worsen ,persist or new concerns arise.  Patient Instructions  Sweating could be from medication  Anxiety me also poss from vyvanse but  Probably not .  Avoid excess caffiene to help with anxiety Get in with counselor aon campus also . We can change medication  To see if is more effective . Change to celexa 20 mg per day  For now .   Consideration  of effexor or cymbalta    Plan rov or contact us on my chart in 1 month   sometimes rocky in transition.  Will notify you  of labs when available.    Neta MendsWanda K. Panosh M.D.

## 2014-07-22 NOTE — Patient Instructions (Signed)
Sweating could be from medication  Anxiety me also poss from vyvanse but  Probably not .  Avoid excess caffiene to help with anxiety Get in with counselor aon campus also . We can change medication  To see if is more effective . Change to celexa 20 mg per day  For now .   Consideration  of effexor or cymbalta    Plan rov or contact us on my chart in 1 month   sometimes rocky in transition.  Will notify you  of labs when available.

## 2014-07-26 ENCOUNTER — Ambulatory Visit: Payer: BC Managed Care – PPO | Admitting: Internal Medicine

## 2014-09-06 ENCOUNTER — Telehealth: Payer: Self-pay | Admitting: Internal Medicine

## 2014-09-06 NOTE — Telephone Encounter (Signed)
Pt said she has a Chief Operating Officernew insurance [employer]. [facility] Fabian SharpPanosh tell them why she is on  lisdexamfetamine (VYVANSE) 40 MG capsule. Pt said [employer]  told her that they will approve ADDERALL. Pt said she need a refill and will take the adderall

## 2014-09-06 NOTE — Telephone Encounter (Signed)
This is not a simple medicine change  .  See my last note  Need OV   To decide on med changes  Need updated med list.  AND      List from her insurance  Of covered meds for adhd

## 2014-09-08 MED ORDER — LISDEXAMFETAMINE DIMESYLATE 40 MG PO CAPS: 40.0000 mg | ORAL_CAPSULE | ORAL | Status: DC

## 2014-09-08 NOTE — Telephone Encounter (Signed)
[facility]

## 2014-09-08 NOTE — Telephone Encounter (Signed)
Spoke to the patient's mom.  She would like to get a 30 day supply of the Vyvanse.  Will pay out of pocket for this month and will start a prior authorization process to get authorization from the insurance company.  Explained to the patient's mom that there may be other medications that will have to be tried and failed before we can get the Vyvanse approved.  Also spoke with [facility] [place] [phone]) and confirmed that the 90 day prescription that was written on 07-22-14 has been destroyed.  Chrissie NoaWilliam informed me that the pt was given 3 pills from that prescription.  Will now print a 30 day supply and place at the front desk.  Trish (mom) notified to pick up  At the front desk.

## 2014-09-08 NOTE — Addendum Note (Signed)
Addended by: Raj JanusADKINS, Adriene Padula T on: 09/08/2014 01:43 PM   Modules accepted: Orders

## 2014-09-09 NOTE — Telephone Encounter (Signed)
PA for Vyvanse was approved.  09/09/14-09/10/15 #[id number].

## 2014-10-14 ENCOUNTER — Telehealth: Payer: Self-pay | Admitting: Internal Medicine

## 2014-10-14 NOTE — Telephone Encounter (Signed)
° ° ° °  Pt request refill of the following: ° °lisdexamfetamine (VYVANSE) 40 MG capsule ° ° °Phamacy: °

## 2014-10-15 MED ORDER — LISDEXAMFETAMINE DIMESYLATE 40 MG PO CAPS: 40.0000 mg | ORAL_CAPSULE | ORAL | Status: DC

## 2014-10-15 NOTE — Telephone Encounter (Signed)
Pt picked up in the office.  Pt notified that she should have an office visit within 30 days.

## 2014-10-15 NOTE — Telephone Encounter (Signed)
° ° ° °  Pt request refill of the following: ° °lisdexamfetamine (VYVANSE) 40 MG capsule ° ° °Phamacy: °

## 2014-10-27 ENCOUNTER — Other Ambulatory Visit: Payer: Self-pay | Admitting: Internal Medicine

## 2014-10-27 NOTE — Telephone Encounter (Signed)
Denied.  Filled for four months in Dec. 2015.  Request is too early.

## 2014-11-07 ENCOUNTER — Telehealth: Payer: Self-pay | Admitting: Internal Medicine

## 2014-11-09 NOTE — Telephone Encounter (Signed)
Has appt may 11 can refill through that date

## 2014-11-09 NOTE — Telephone Encounter (Signed)
Pt needs new rx yvvanse 40 mg

## 2014-11-10 MED ORDER — LISDEXAMFETAMINE DIMESYLATE 40 MG PO CAPS: 40.0000 mg | ORAL_CAPSULE | ORAL | Status: AC

## 2014-11-10 NOTE — Telephone Encounter (Signed)
Patient notified to pick up at the front desk. 

## 2014-11-12 ENCOUNTER — Ambulatory Visit: Payer: Self-pay | Admitting: Internal Medicine

## 2014-11-27 ENCOUNTER — Other Ambulatory Visit: Payer: Self-pay | Admitting: Internal Medicine

## 2014-11-29 NOTE — Telephone Encounter (Signed)
Sent to the pharmacy by e-scribe. 

## 2014-12-10 ENCOUNTER — Ambulatory Visit: Payer: Self-pay | Admitting: Internal Medicine

## 2014-12-15 ENCOUNTER — Ambulatory Visit: Payer: Self-pay | Admitting: Internal Medicine
